# Patient Record
Sex: Male | Born: 1990
Health system: Southern US, Community
[De-identification: ages and names within clinical notes are randomized; demographics above are authoritative.]

## PROBLEM LIST (undated history)

## (undated) DIAGNOSIS — Z789 Other specified health status: Secondary | ICD-10-CM

## (undated) HISTORY — DX: Other specified health status: Z78.9

---
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
## 2019-06-07 ENCOUNTER — Ambulatory Visit
Admission: EM | Admit: 2019-06-07 | Discharge: 2019-06-07 | Disposition: A | Discharge status: Home / Self Care | Payer: 59 | Attending: Physician Assistant | Admitting: Physician Assistant

## 2019-06-07 ENCOUNTER — Other Ambulatory Visit: Payer: Self-pay

## 2019-06-07 ENCOUNTER — Encounter: Payer: Self-pay | Admitting: Emergency Medicine

## 2019-06-07 DIAGNOSIS — L2489 Irritant contact dermatitis due to other agents: Secondary | ICD-10-CM

## 2019-06-07 MED ORDER — PREDNISONE 50 MG PO TABS: 50.0000 mg | ORAL_TABLET | Freq: Every day | ORAL | 0 refills | Status: DC

## 2019-06-07 NOTE — Discharge Instructions (Signed)
Start prednisone as directed. Continue cream/benadryl. Ice compress to the eyes. Follow up for reevaluation with signs of infection including spreading redness, warmth, pain.

## 2019-06-07 NOTE — ED Triage Notes (Addendum)
Pt presents to [facility] for assessment of rash after doing yard work with his brother starting 3 days ago.  Rash to right forehead and below left eye.  Pt now c/o swelling to interior eye and was concerned for spreading.  Pt has been using benadryl topical cream to area for itching relief

## 2019-06-07 NOTE — ED Notes (Signed)
Patient able to ambulate independently  

## 2019-06-07 NOTE — ED Provider Notes (Signed)
[name]    CSN: [id number] Arrival date & time: 06/07/19  0801      History   Chief Complaint Chief Complaint  Patient presents with  . Rash    HPI Miguel Farmer is a 28 y.o. male.   28 year old male comes in for 3-day history of rash.  States was doing yard work, and touched Physicist, medical.  Rash is to the right forehead, left cheek.  Area was pruritic in nature, and has been applying Benadryl cream with some relief.  States noticing rash spreading towards the eye, with swelling to the nasal aspect of the left lower eyelid, and therefore came in for evaluation.  Denies spreading erythema, warmth, pain.  Denies fever, chills, body aches.  Denies vision changes, eye discharge, conjunctival injection, photophobia.     History reviewed. No pertinent past medical history.  There are no active problems to display for this patient.   History reviewed. No pertinent surgical history.     Home Medications    Prior to Admission medications   Medication Sig Start Date End Date Taking? Authorizing Provider  predniSONE (DELTASONE) 50 MG tablet Take 1 tablet (50 mg total) by mouth daily with breakfast. 06/07/19   Ok Edwards, PA-C    Family History Family History  Problem Relation Age of Onset  . Hypertension Mother   . Hypertension Father     Social History Social History   Tobacco Use  . Smoking status: Never Smoker  . Smokeless tobacco: Never Used  Substance Use Topics  . Alcohol use: Yes    Comment: socially  . Drug use: Never     Allergies   Patient has no known allergies.   Review of Systems Review of Systems  Reason unable to perform ROS: See HPI as above.     Physical Exam Triage Vital Signs ED Triage Vitals [06/07/19 0813]  Enc Vitals Group     BP (!) 148/84     Pulse Rate 76     Resp 18     Temp 98 F (36.7 C)     Temp Source Temporal     SpO2 97 %     Weight      Height      Head Circumference      Peak Flow      Pain  Score 0     Pain Loc      Pain Edu?      Excl. in [place]?    No data found.  Updated Vital Signs BP (!) 148/84 (BP Location: Left Arm)   Pulse 76   Temp 98 F (36.7 C) (Temporal)   Resp 18   SpO2 97%    Physical Exam Constitutional:      General: He is not in acute distress.    Appearance: He is well-developed. He is not diaphoretic.  HENT:     Head: Normocephalic and atraumatic.  Eyes:     Extraocular Movements: Extraocular movements intact.     Conjunctiva/sclera: Conjunctivae normal.     Pupils: Pupils are equal, round, and reactive to light.     Comments: Left lower eyelid swelling without erythema, warmth.  No tenderness to palpation.  No pain with EOM.  Pulmonary:     Effort: Pulmonary effort is normal. No respiratory distress.  Skin:    General: Skin is warm and dry.     Comments: Maculopapular rash with skin thickening and surrounding erythema to the right forehead and left cheek.  No tenderness to palpation.  No fluctuance, warmth.  Neurological:     Mental Status: He is alert and oriented to person, place, and time.      UC Treatments / Results  Labs (all labs ordered are listed, but only abnormal results are displayed) Labs Reviewed - No data to display  EKG   Radiology No results found.  Procedures Procedures (including critical care time)  Medications Ordered in UC Medications - No data to display  Initial Impression / Assessment and Plan / UC Course  I have reviewed the triage vital signs and the nursing notes.  Pertinent labs & imaging results that were available during my care of the patient were reviewed by me and considered in my medical decision making (see chart for details).    Start prednisone as directed.  Patient apply ice compress to the eyes.  Return precautions given.  Patient expresses understanding and agrees to plan.  Final Clinical Impressions(s) / UC Diagnoses   Final diagnoses:  Irritant contact dermatitis due to other  agents   ED Prescriptions    Medication Sig Dispense Auth. Provider   predniSONE (DELTASONE) 50 MG tablet Take 1 tablet (50 mg total) by mouth daily with breakfast. 5 tablet Ok Edwards, PA-C     PDMP not reviewed this encounter.   Ok Edwards, PA-C 06/07/19 [phone]

## 2019-09-02 ENCOUNTER — Other Ambulatory Visit: Payer: 59

## 2019-10-02 DIAGNOSIS — Z20828 Contact with and (suspected) exposure to other viral communicable diseases: Secondary | ICD-10-CM | POA: Diagnosis not present

## 2019-10-02 DIAGNOSIS — Z03818 Encounter for observation for suspected exposure to other biological agents ruled out: Secondary | ICD-10-CM | POA: Diagnosis not present

## 2019-12-15 ENCOUNTER — Other Ambulatory Visit: Payer: Self-pay

## 2019-12-15 ENCOUNTER — Other Ambulatory Visit: Payer: Self-pay | Admitting: Family Medicine

## 2019-12-15 ENCOUNTER — Ambulatory Visit: Payer: 59 | Admitting: Family Medicine

## 2019-12-15 ENCOUNTER — Encounter: Payer: Self-pay | Admitting: Family Medicine

## 2019-12-15 VITALS — BP 108/62 | HR 81 | Temp 96.1°F | Ht 71.0 in | Wt 185.1 lb

## 2019-12-15 DIAGNOSIS — E785 Hyperlipidemia, unspecified: Secondary | ICD-10-CM

## 2019-12-15 DIAGNOSIS — Z0000 Encounter for general adult medical examination without abnormal findings: Secondary | ICD-10-CM

## 2019-12-15 LAB — LIPID PANEL
Cholesterol: 195 mg/dL (ref 0–200)
HDL: 44.1 mg/dL (ref >39.00)
NonHDL: 151.05
Total CHOL/HDL Ratio: 4
Triglycerides: 209 mg/dL — ABNORMAL HIGH (ref 0.0–149.0)
VLDL: 41.8 mg/dL — ABNORMAL HIGH (ref 0.0–40.0)

## 2019-12-15 LAB — COMPREHENSIVE METABOLIC PANEL
ALT: 30 U/L (ref 0–53)
AST: 21 U/L (ref 0–37)
Albumin: 4.7 g/dL (ref 3.5–5.2)
Alkaline Phosphatase: 56 U/L (ref 39–117)
BUN: 13 mg/dL (ref 6–23)
CO2: 28 mEq/L (ref 19–32)
Calcium: 9.6 mg/dL (ref 8.4–10.5)
Chloride: 103 mEq/L (ref 96–112)
Creatinine, Ser: 0.96 mg/dL (ref 0.40–1.50)
GFR: 91.95 mL/min (ref >60.00)
Glucose, Bld: 91 mg/dL (ref 70–99)
Potassium: 4.3 mEq/L (ref 3.5–5.1)
Sodium: 139 mEq/L (ref 135–145)
Total Bilirubin: 0.8 mg/dL (ref 0.2–1.2)
Total Protein: 7.2 g/dL (ref 6.0–8.3)

## 2019-12-15 LAB — CBC
HCT: 43.3 % (ref 39.0–52.0)
Hemoglobin: 14.5 g/dL (ref 13.0–17.0)
MCHC: 33.5 g/dL (ref 30.0–36.0)
MCV: 85.3 fl (ref 78.0–100.0)
Platelets: 226 10*3/uL (ref 150.0–400.0)
RBC: 5.08 Mil/uL (ref 4.22–5.81)
RDW: 13.1 % (ref 11.5–15.5)
WBC: 5 10*3/uL (ref 4.0–10.5)

## 2019-12-15 LAB — LDL CHOLESTEROL, DIRECT: Direct LDL: 110 mg/dL

## 2019-12-15 NOTE — Patient Instructions (Addendum)
Give [place] 2-3 business days to get the results of your labs back.   Keep the diet clean and stay active.  Do monthly self testicular checks in the shower. You are feeling for lumps/bumps that don't belong. If you feel anything like this, let me know!   If anything changes with the skin lesions on your thigh or foot, let me know. I am reassured in the fact that nothing has changed over the past 6 years.   Let us know if you need anything.

## 2019-12-15 NOTE — Progress Notes (Signed)
Chief Complaint  Patient presents with  . New Patient (Initial Visit)    Well Male Miguel Farmer is here for a complete physical.   His last physical was >1 year ago.  Current diet: in general, a "healthy" diet.   Current exercise: Active with kids and at home Weight trend: had lost some weight Daytime fatigue? No. Seat belt? Yes.    Health maintenance Tetanus- Yes HIV- Yes  Past Medical History:  Diagnosis Date  . No known health problems      Past Surgical History:  Procedure Laterality Date  . WISDOM TOOTH EXTRACTION  2007    Medications  Takes no meds routinely.    Allergies No Known Allergies  Family History Family History  Problem Relation Age of Onset  . Liver disease Mother 54       NAFLD    Review of Systems: Constitutional: no fevers or chills Eye:  no recent significant change in vision Ear/Nose/Mouth/Throat:  Ears:  no hearing loss Nose/Mouth/Throat:  no complaints of nasal congestion, no sore throat Cardiovascular:  no chest pain Respiratory:  no shortness of breath Gastrointestinal:  no abdominal pain, no change in bowel habits GU:  Male: negative for dysuria, frequency, and incontinence Musculoskeletal/Extremities:  no pain of the joints Integumentary (Skin/Breast): +2 dark lesions on R foot and back of L thigh that have not changed in 6 years; otherwise no abnormal skin lesions reported Neurologic:  no headaches Endocrine: No unexpected weight changes Hematologic/Lymphatic:  no night sweats  Exam BP 108/62 (BP Location: Left Arm, Patient Position: Sitting, Cuff Size: Normal)   Pulse 81   Temp (!) 96.1 F (35.6 C) (Temporal)   Ht 5\' 11"  (1.803 m)   Wt 185 lb 2 oz (84 kg)   SpO2 98%   BMI 25.82 kg/m  General:  well developed, well nourished, in no apparent distress Skin: See below; otherwise no significant moles, warts, or growths Head:  no masses, lesions, or tenderness Eyes:  pupils equal and round, sclera anicteric without  injection Ears:  canals without lesions, TMs shiny without retraction, no obvious effusion, no erythema Nose:  nares patent, septum midline, mucosa normal Throat/Pharynx:  lips and gingiva without lesion; tongue and uvula midline; non-inflamed pharynx; no exudates or postnasal drainage Neck: neck supple without adenopathy, thyromegaly, or masses Lungs:  clear to auscultation, breath sounds equal bilaterally, no respiratory distress Cardio:  regular rate and rhythm, no bruits, no LE edema Abdomen:  abdomen soft, nontender; bowel sounds normal; no masses or organomegaly Rectal: Deferred Musculoskeletal:  symmetrical muscle groups noted without atrophy or deformity Extremities:  no clubbing, cyanosis, or edema, no deformities, no skin discoloration Neuro:  gait normal; deep tendon reflexes normal and symmetric Psych: well oriented with normal range of affect and appropriate judgment/insight   Posterior L thigh   Dorsum of R foot  Assessment and Plan  Well adult exam - Plan: Lipid panel, CBC, Comprehensive metabolic panel   Well 29 y.o. male. Counseled on diet and exercise. Self testicular exams recommended at least monthly.  Other orders as above. Keep an eye on skin lesions, he will let us know if they change.  Follow up in 1 year pending the above workup. The patient voiced understanding and agreement to the plan.  [place], DO 12/15/19 7:45 AM

## 2020-02-02 ENCOUNTER — Other Ambulatory Visit (INDEPENDENT_AMBULATORY_CARE_PROVIDER_SITE_OTHER): Payer: 59

## 2020-02-02 ENCOUNTER — Other Ambulatory Visit: Payer: Self-pay

## 2020-02-02 ENCOUNTER — Other Ambulatory Visit: Payer: Self-pay | Admitting: Family Medicine

## 2020-02-02 DIAGNOSIS — E785 Hyperlipidemia, unspecified: Secondary | ICD-10-CM

## 2020-02-02 LAB — LIPID PANEL
Cholesterol: 159 mg/dL (ref 0–200)
HDL: 41.3 mg/dL (ref >39.00)
LDL Cholesterol: 80 mg/dL (ref 0–99)
NonHDL: 117.35
Total CHOL/HDL Ratio: 4
Triglycerides: 189 mg/dL — ABNORMAL HIGH (ref 0.0–149.0)
VLDL: 37.8 mg/dL (ref 0.0–40.0)

## 2020-02-15 ENCOUNTER — Other Ambulatory Visit: Payer: Self-pay

## 2020-02-15 ENCOUNTER — Ambulatory Visit
Admission: RE | Admit: 2020-02-15 | Discharge: 2020-02-15 | Disposition: A | Discharge status: Home / Self Care | Payer: 59 | Source: Ambulatory Visit | Attending: Emergency Medicine | Admitting: Emergency Medicine

## 2020-02-15 VITALS — BP 131/94 | HR 83 | Temp 98.3°F | Resp 18

## 2020-02-15 DIAGNOSIS — L237 Allergic contact dermatitis due to plants, except food: Secondary | ICD-10-CM | POA: Diagnosis not present

## 2020-02-15 MED ORDER — PREDNISONE 10 MG (21) PO TBPK: ORAL_TABLET | Freq: Every day | ORAL | 0 refills | Status: DC

## 2020-02-15 MED ORDER — TRIAMCINOLONE ACETONIDE 0.5 % EX OINT: 1.0000 "application " | TOPICAL_OINTMENT | Freq: Two times a day (BID) | CUTANEOUS | 0 refills | Status: DC

## 2020-02-15 MED FILL — TRIAMCINOLONE 0.5% OINTMENT: 0.5 | 15 days supply | Qty: 30 | Fill #0

## 2020-02-15 MED FILL — predniSONE 10 MG (21) TBPK: 10 | 6 days supply | Qty: 21 | Fill #0

## 2020-02-15 NOTE — ED Provider Notes (Signed)
[name]    CSN: [id number] Arrival date & time: 02/15/20  2725      History   Chief Complaint Chief Complaint  Patient presents with  . Rash    HPI Miguel Farmer is a 29 y.o. male presenting for poison ivy dermatitis to bilateral lower extremities.  Patient states that this began a week ago when he was out in the brush on his property chasing dogs away from his chickens.  Denies dog bite, fall.  Has been keeping clean and dry, using calamine lotion with some relief.  Denying fever, chills, throbs, myalgias, warmth, discharge.  States he has tolerated steroids well in the past.   Past Medical History:  Diagnosis Date  . No known health problems     There are no problems to display for this patient.   Past Surgical History:  Procedure Laterality Date  . [place] EXTRACTION  2007       Home Medications    Prior to Admission medications   Medication Sig Start Date End Date Taking? Authorizing Provider  predniSONE (STERAPRED UNI-PAK 21 TAB) 10 MG (21) TBPK tablet Take by mouth daily. Take steroid taper as written 02/15/20   Hall-Potvin, [place], PA-C  triamcinolone ointment (KENALOG) 0.5 % Apply 1 application topically 2 (two) times daily. 02/15/20   Hall-Potvin, [place], PA-C    Family History Family History  Problem Relation Age of Onset  . Liver disease Mother 23       NAFLD    Social History Social History   Tobacco Use  . Smoking status: Never Smoker  . Smokeless tobacco: Never Used  Substance Use Topics  . Alcohol use: Yes    Comment: socially  . Drug use: Never     Allergies   Patient has no known allergies.   Review of Systems As per HPI   Physical Exam Triage Vital Signs ED Triage Vitals  Enc Vitals Group     BP      Pulse      Resp      Temp      Temp src      SpO2      Weight      Height      Head Circumference      Peak Flow      Pain Score      Pain Loc      Pain Edu?      Excl. in [place]?    No data  found.  Updated Vital Signs BP (!) 131/94 (BP Location: Left Arm)   Pulse 83   Temp 98.3 F (36.8 C)   Resp 18   SpO2 97%   Visual Acuity Right Eye Distance:   Left Eye Distance:   Bilateral Distance:    Right Eye Near:   Left Eye Near:    Bilateral Near:     Physical Exam Constitutional:      General: He is not in acute distress. HENT:     Head: Normocephalic and atraumatic.  Eyes:     General: No scleral icterus.    Pupils: Pupils are equal, round, and reactive to light.  Cardiovascular:     Rate and Rhythm: Normal rate.  Pulmonary:     Effort: Pulmonary effort is normal. No respiratory distress.     Breath sounds: No wheezing.  Skin:    Coloration: Skin is not jaundiced or pale.     Findings: Rash present.     Comments:  Moderate to severe poison ivy dermatitis noted to bilateral lower extremities without active discharge, streaking, tenderness.  Neurological:     Mental Status: He is alert and oriented to person, place, and time.      UC Treatments / Results  Labs (all labs ordered are listed, but only abnormal results are displayed) Labs Reviewed - No data to display  EKG   Radiology No results found.  Procedures Procedures (including critical care time)  Medications Ordered in UC Medications - No data to display  Initial Impression / Assessment and Plan / UC Course  I have reviewed the triage vital signs and the nursing notes.  Pertinent labs & imaging results that were available during my care of the patient were reviewed by me and considered in my medical decision making (see chart for details).     Patient afebrile, nontoxic in office today.  No signs/symptoms at this time of secondary cellulitis.  Will start prednisone taper as outlined below.  Return precautions discussed, patient verbalized understanding and is agreeable to plan. Final Clinical Impressions(s) / UC Diagnoses   Final diagnoses:  Poison ivy dermatitis     Discharge  Instructions     Keep skin clean - may use gentle soaps without perfumes/dyes. Avoid hot water (showers, baths) as this can further dry out and irritate skin. Pat skin dry as rubbing can irritate and tear skin. Apply a gentle moisturizer 1-2 times daily. Take steroid as discussed with breakfast: 6-5-4-3-2-1 Return for worsening rash, fever, body aches.    ED Prescriptions    Medication Sig Dispense Auth. Provider   predniSONE (STERAPRED UNI-PAK 21 TAB) 10 MG (21) TBPK tablet Take by mouth daily. Take steroid taper as written 21 tablet Hall-Potvin, [place], PA-C   triamcinolone ointment (KENALOG) 0.5 % Apply 1 application topically 2 (two) times daily. 30 g Hall-Potvin, [place], PA-C     PDMP not reviewed this encounter.   Hall-Potvin, [place], [place] 02/15/20 [phone]

## 2020-02-15 NOTE — ED Triage Notes (Signed)
APP assessment occurred prior to RN assessment, please see APP note.

## 2020-02-15 NOTE — Discharge Instructions (Signed)
Keep skin clean - may use gentle soaps without perfumes/dyes. Avoid hot water (showers, baths) as this can further dry out and irritate skin. Pat skin dry as rubbing can irritate and tear skin. Apply a gentle moisturizer 1-2 times daily. Take steroid as discussed with breakfast: 6-5-4-3-2-1 Return for worsening rash, fever, body aches.

## 2020-02-15 NOTE — ED Notes (Signed)
Patient able to ambulate independently  

## 2020-04-29 ENCOUNTER — Ambulatory Visit: Payer: 59 | Admitting: Family Medicine

## 2020-04-29 ENCOUNTER — Other Ambulatory Visit: Payer: Self-pay

## 2020-04-29 ENCOUNTER — Encounter: Payer: Self-pay | Admitting: Family Medicine

## 2020-04-29 VITALS — BP 122/88 | HR 84 | Temp 98.3°F | Resp 18 | Ht 71.0 in | Wt 199.6 lb

## 2020-04-29 DIAGNOSIS — L247 Irritant contact dermatitis due to plants, except food: Secondary | ICD-10-CM

## 2020-04-29 MED ORDER — METHYLPREDNISOLONE ACETATE 80 MG/ML IJ SUSP
80.0000 mg | Freq: Once | INTRAMUSCULAR | Status: AC
  Administered 2020-04-29: 80 mg via INTRAMUSCULAR

## 2020-04-29 MED ORDER — PREDNISONE 10 MG PO TABS: ORAL_TABLET | ORAL | 0 refills | Status: DC

## 2020-04-29 MED FILL — predniSONE 10 MG TABS: 10 | 12 days supply | Qty: 20 | Fill #0

## 2020-04-29 NOTE — Patient Instructions (Signed)
Contact Dermatitis Dermatitis is redness, soreness, and swelling (inflammation) of the skin. Contact dermatitis is a reaction to certain substances that touch the skin. Many different substances can cause contact dermatitis. There are two types of contact dermatitis:  Irritant contact dermatitis. This type is caused by something that irritates your skin, such as having dry hands from washing them too often with soap. This type does not require previous exposure to the substance for a reaction to occur. This is the most common type.  Allergic contact dermatitis. This type is caused by a substance that you are allergic to, such as poison ivy. This type occurs when you have been exposed to the substance (allergen) and develop a sensitivity to it. Dermatitis may develop soon after your first exposure to the allergen, or it may not develop until the next time you are exposed and every time thereafter. What are the causes? Irritant contact dermatitis is most commonly caused by exposure to:  Makeup.  Soaps.  Detergents.  Bleaches.  Acids.  Metal salts, such as nickel. Allergic contact dermatitis is most commonly caused by exposure to:  Poisonous plants.  Chemicals.  Jewelry.  Latex.  Medicines.  Preservatives in products, such as clothing. What increases the risk? You are more likely to develop this condition if you have:  A job that exposes you to irritants or allergens.  Certain medical conditions, such as asthma or eczema. What are the signs or symptoms? Symptoms of this condition may occur on your body anywhere the irritant has touched you or is touched by you.  Symptoms include: ? Dryness or flaking. ? Redness. ? Cracks. ? Itching. ? Pain or a burning feeling. ? Blisters. ? Drainage of small amounts of blood or clear fluid from skin cracks. With allergic contact dermatitis, there may also be swelling in areas such as the eyelids, mouth, or genitals. How is this  diagnosed? This condition is diagnosed with a medical history and physical exam.  A patch skin test may be performed to help determine the cause.  If the condition is related to your job, you may need to see an occupational medicine specialist. How is this treated? This condition is treated by checking for the cause of the reaction and protecting your skin from further contact. Treatment may also include:  Steroid creams or ointments. Oral steroid medicines may be needed in more severe cases.  Antibiotic medicines or antibacterial ointments, if a skin infection is present.  Antihistamine lotion or an antihistamine taken by mouth to ease itching.  A bandage (dressing). Follow these instructions at home: Skin care  Moisturize your skin as needed.  Apply cool compresses to the affected areas.  Try applying baking soda paste to your skin. Stir water into baking soda until it reaches a paste-like consistency.  Do not scratch your skin, and avoid friction to the affected area.  Avoid the use of soaps, perfumes, and dyes. Medicines  Take or apply over-the-counter and prescription medicines only as told by your health care provider.  If you were prescribed an antibiotic medicine, take or apply the antibiotic as told by your health care provider. Do not stop using the antibiotic even if your condition improves. Bathing  Try taking a bath with: ? Epsom salts. Follow the instructions on the packaging. You can get these at your local pharmacy or grocery store. ? Baking soda. Pour a small amount into the bath as directed by your health care provider. ? Colloidal oatmeal. Follow the instructions on the   packaging. You can get this at your local pharmacy or grocery store.  Bathe less frequently, such as every other day.  Bathe in lukewarm water. Avoid using hot water. Bandage care  If you were given a bandage (dressing), change it as told by your health care provider.  Wash your hands  with soap and water before and after you change your dressing. If soap and water are not available, use hand sanitizer. General instructions  Avoid the substance that caused your reaction. If you do not know what caused it, keep a journal to try to track what caused it. Write down: ? What you eat. ? What cosmetic products you use. ? What you drink. ? What you wear in the affected area. This includes jewelry.  Check the affected areas every day for signs of infection. Check for: ? More redness, swelling, or pain. ? More fluid or blood. ? Warmth. ? Pus or a bad smell.  Keep all follow-up visits as told by your health care provider. This is important. Contact a health care provider if:  Your condition does not improve with treatment.  Your condition gets worse.  You have signs of infection such as swelling, tenderness, redness, soreness, or warmth in the affected area.  You have a fever.  You have new symptoms. Get help right away if:  You have a severe headache, neck pain, or neck stiffness.  You vomit.  You feel very sleepy.  You notice red streaks coming from the affected area.  Your bone or joint underneath the affected area becomes painful after the skin has healed.  The affected area turns darker.  You have difficulty breathing. Summary  Dermatitis is redness, soreness, and swelling (inflammation) of the skin. Contact dermatitis is a reaction to certain substances that touch the skin.  Symptoms of this condition may occur on your body anywhere the irritant has touched you or is touched by you.  This condition is treated by figuring out what caused the reaction and protecting your skin from further contact. Treatment may also include medicines and skin care.  Avoid the substance that caused your reaction. If you do not know what caused it, keep a journal to try to track what caused it.  Contact a health care provider if your condition gets worse or you have signs  of infection such as swelling, tenderness, redness, soreness, or warmth in the affected area. This information is not intended to replace advice given to you by your health care provider. Make sure you discuss any questions you have with your health care provider. Document Revised: 11/26/2018 Document Reviewed: 02/19/2018 Elsevier Patient Education  2020 [employer]vier Inc.  

## 2020-04-29 NOTE — Progress Notes (Signed)
Patient ID: Miguel Farmer, male    DOB: 24-Apr-1991  Age: 29 y.o. MRN: [id number]    Subjective:  Subjective  HPI CALIEB LICHTMAN presents for rash after being in contact with poison sumac, ivy ---- he was chasing after a fox that was going after his free range chickens in his yard.    He is in the process of clearing the poison ivy but has not yet and is highly allergic to it   Review of Systems  Constitutional: Negative for appetite change, diaphoresis, fatigue and unexpected weight change.  Eyes: Negative for pain, redness and visual disturbance.  Respiratory: Negative for cough, chest tightness, shortness of breath and wheezing.   Cardiovascular: Negative for chest pain, palpitations and leg swelling.  Endocrine: Negative for cold intolerance, heat intolerance, polydipsia, polyphagia and polyuria.  Genitourinary: Negative for difficulty urinating, dysuria and frequency.  Skin: Positive for rash.  Neurological: Negative for dizziness, light-headedness, numbness and headaches.    History Past Medical History:  Diagnosis Date  . No known health problems     He has a past surgical history that includes Wisdom tooth extraction (2007).   His family history includes Liver disease (age of onset: 58) in his mother.He reports that he has never smoked. He has never used smokeless tobacco. He reports current alcohol use. He reports that he does not use drugs.  No current outpatient medications on file prior to visit.   No current facility-administered medications on file prior to visit.     Objective:  Objective  Physical Exam Vitals and nursing note reviewed.  Constitutional:      General: He is sleeping.     Appearance: He is well-developed.  HENT:     Head: Normocephalic and atraumatic.  Eyes:     Pupils: Pupils are equal, round, and reactive to light.  Neck:     Thyroid: No thyromegaly.  Cardiovascular:     Rate and Rhythm: Normal rate and regular rhythm.     Heart sounds: No  murmur heard.   Pulmonary:     Effort: Pulmonary effort is normal. No respiratory distress.     Breath sounds: Normal breath sounds. No wheezing or rales.  Chest:     Chest wall: No tenderness.  Musculoskeletal:        General: No tenderness.     Cervical back: Normal range of motion and neck supple.  Skin:    General: Skin is warm and dry.     Findings: Rash present. Rash is vesicular.       Neurological:     Mental Status: He is oriented to person, place, and time.  Psychiatric:        Behavior: Behavior normal.        Thought Content: Thought content normal.        Judgment: Judgment normal.    BP 122/88   Pulse 84   Temp 98.3 F (36.8 C) (Oral)   Resp 18   Ht 5\' 11"  (1.803 m)   Wt 199 lb 9.6 oz (90.5 kg)   SpO2 97%   BMI 27.84 kg/m  Wt Readings from Last 3 Encounters:  04/29/20 199 lb 9.6 oz (90.5 kg)  12/15/19 185 lb 2 oz (84 kg)     Lab Results  Component Value Date   WBC 5.0 12/15/2019   HGB 14.5 12/15/2019   HCT 43.3 12/15/2019   PLT 226.0 12/15/2019   GLUCOSE 91 12/15/2019   CHOL 159 02/02/2020   TRIG  189.0 (H) 02/02/2020   HDL 41.30 02/02/2020   LDLDIRECT 110.0 12/15/2019   LDLCALC 80 02/02/2020   ALT 30 12/15/2019   AST 21 12/15/2019   NA 139 12/15/2019   K 4.3 12/15/2019   CL 103 12/15/2019   CREATININE 0.96 12/15/2019   BUN 13 12/15/2019   CO2 28 12/15/2019    No results found.   Assessment & Plan:  Plan  I have discontinued Vishnu Moeller. [place]'s predniSONE and triamcinolone ointment. I am also having him start on predniSONE. We administered methylPREDNISolone acetate.  Meds ordered this encounter  Medications  . predniSONE (DELTASONE) 10 MG tablet    Sig: TAKE 3 TABLETS PO QD FOR 3 DAYS THEN TAKE 2 TABLETS PO QD FOR 3 DAYS THEN TAKE 1 TABLET PO QD FOR 3 DAYS THEN TAKE 1/2 TAB PO QD FOR 3 DAYS    Dispense:  20 tablet    Refill:  0  . methylPREDNISolone acetate (DEPO-MEDROL) injection 80 mg    Problem List Items Addressed This Visit      None    Visit Diagnoses    Contact dermatitis and eczema due to plant    -  Primary   Relevant Medications   predniSONE (DELTASONE) 10 MG tablet   methylPREDNISolone acetate (DEPO-MEDROL) injection 80 mg (Completed)     Can use benadryl for itching rto prn  Follow-up: Return if symptoms worsen or fail to improve.  Ann Held, DO

## 2020-06-03 NOTE — Addendum Note (Signed)
Addended by: Kelle Darting A on: 06/03/2020 03:17 PM   Modules accepted: Orders

## 2020-06-07 ENCOUNTER — Other Ambulatory Visit: Payer: 59

## 2020-11-16 ENCOUNTER — Other Ambulatory Visit: Payer: Self-pay

## 2020-11-16 ENCOUNTER — Ambulatory Visit: Payer: 59 | Admitting: Family Medicine

## 2020-11-16 ENCOUNTER — Other Ambulatory Visit: Payer: Self-pay | Admitting: Family Medicine

## 2020-11-16 ENCOUNTER — Encounter: Payer: Self-pay | Admitting: Family Medicine

## 2020-11-16 VITALS — BP 124/86 | HR 85 | Temp 98.3°F | Ht 71.0 in | Wt 211.1 lb

## 2020-11-16 DIAGNOSIS — L255 Unspecified contact dermatitis due to plants, except food: Secondary | ICD-10-CM

## 2020-11-16 MED ORDER — PREDNISONE 20 MG PO TABS: ORAL_TABLET | ORAL | 0 refills | Status: DC

## 2020-11-16 MED ORDER — LEVOCETIRIZINE DIHYDROCHLORIDE 5 MG PO TABS: 5.0000 mg | ORAL_TABLET | Freq: Every evening | ORAL | 2 refills | Status: DC

## 2020-11-16 MED FILL — LEVOCETIRIZINE 5 MG TABLET: 5 | 30 days supply | Qty: 30 | Fill #0

## 2020-11-16 MED FILL — predniSONE 20 MG TABS: 20 | 21 days supply | Qty: 42 | Fill #0

## 2020-11-16 NOTE — Patient Instructions (Signed)
Try not to itch.  Ice/cold pack over area for 10-15 min twice daily.  Things to look out for: increasing pain not relieved by ibuprofen/acetaminophen, fevers, spreading redness, drainage of pus, or foul odor.  Let us know if you need anything.

## 2020-11-16 NOTE — Progress Notes (Signed)
Chief Complaint  Patient presents with  . Poison Ivy    Miguel Farmer is a 30 y.o. male here for a skin complaint.  Duration: 6 days Location: forearms and legs Pruritic? Yes Painful? No Drainage? Light oozing New soaps/lotions/topicals/detergents? No Sick contacts? No Other associated symptoms: spreading; he was exposed to poison sumac Therapies tried thus far: topical steroid cream  Past Medical History:  Diagnosis Date  . No known health problems     BP 124/86 (BP Location: Left Arm, Patient Position: Sitting, Cuff Size: Normal)   Pulse 85   Temp 98.3 F (36.8 C) (Oral)   Ht 5\' 11"  (1.803 m)   Wt 211 lb 2 oz (95.8 kg)   SpO2 98%   BMI 29.45 kg/m  Gen: awake, alert, appearing stated age Lungs: No accessory muscle use Skin: Bilateral forearms and bilateral lower extremities, linear streaks of pink and slightly raised skin, some excoriation, some vesicle formation noted. No drainage, erythema, TTP, fluctuance Psych: Age appropriate judgment and insight  Rhus dermatitis - Plan: predniSONE (DELTASONE) 20 MG tablet, levocetirizine (XYZAL) 5 MG tablet  Daily antihistamine, 3-week prednisone taper, 60 mg for a week, 40 mg for the next week, and 20 mg with a final week.  Ice/cold pack for symptoms.  Try not to itch. F/u prn. The patient voiced understanding and agreement to the plan.  [place], DO 11/16/20 7:58 AM

## 2021-01-09 ENCOUNTER — Other Ambulatory Visit: Payer: Self-pay

## 2021-01-09 ENCOUNTER — Ambulatory Visit (INDEPENDENT_AMBULATORY_CARE_PROVIDER_SITE_OTHER): Payer: 59 | Admitting: Family Medicine

## 2021-01-09 ENCOUNTER — Encounter: Payer: Self-pay | Admitting: Family Medicine

## 2021-01-09 VITALS — BP 120/80 | HR 50 | Temp 98.4°F | Ht 71.0 in | Wt 214.0 lb

## 2021-01-09 DIAGNOSIS — Z1159 Encounter for screening for other viral diseases: Secondary | ICD-10-CM | POA: Diagnosis not present

## 2021-01-09 DIAGNOSIS — Z0000 Encounter for general adult medical examination without abnormal findings: Secondary | ICD-10-CM

## 2021-01-09 LAB — LIPID PANEL
Cholesterol: 186 mg/dL (ref 0–200)
HDL: 41.4 mg/dL (ref >39.00)
NonHDL: 144.12
Total CHOL/HDL Ratio: 4
Triglycerides: 204 mg/dL — ABNORMAL HIGH (ref 0.0–149.0)
VLDL: 40.8 mg/dL — ABNORMAL HIGH (ref 0.0–40.0)

## 2021-01-09 LAB — LDL CHOLESTEROL, DIRECT: Direct LDL: 112 mg/dL

## 2021-01-09 LAB — CBC
HCT: 43.7 % (ref 39.0–52.0)
Hemoglobin: 14.7 g/dL (ref 13.0–17.0)
MCHC: 33.6 g/dL (ref 30.0–36.0)
MCV: 84.9 fl (ref 78.0–100.0)
Platelets: 236 10*3/uL (ref 150.0–400.0)
RBC: 5.14 Mil/uL (ref 4.22–5.81)
RDW: 13.7 % (ref 11.5–15.5)
WBC: 5.2 10*3/uL (ref 4.0–10.5)

## 2021-01-09 LAB — COMPREHENSIVE METABOLIC PANEL
ALT: 47 U/L (ref 0–53)
AST: 24 U/L (ref 0–37)
Albumin: 4.8 g/dL (ref 3.5–5.2)
Alkaline Phosphatase: 56 U/L (ref 39–117)
BUN: 21 mg/dL (ref 6–23)
CO2: 27 mEq/L (ref 19–32)
Calcium: 9.7 mg/dL (ref 8.4–10.5)
Chloride: 101 mEq/L (ref 96–112)
Creatinine, Ser: 0.96 mg/dL (ref 0.40–1.50)
GFR: 105.63 mL/min (ref >60.00)
Glucose, Bld: 94 mg/dL (ref 70–99)
Potassium: 4.8 mEq/L (ref 3.5–5.1)
Sodium: 137 mEq/L (ref 135–145)
Total Bilirubin: 0.6 mg/dL (ref 0.2–1.2)
Total Protein: 7.4 g/dL (ref 6.0–8.3)

## 2021-01-09 NOTE — Progress Notes (Signed)
Chief Complaint  Patient presents with  . Annual Exam    Well Male Miguel Farmer is here for a complete physical.   His last physical was >1 year ago.  Current diet: in general, a "fair" diet.   Current exercise: active in yard and with kids Weight trend: stable Fatigue out of ordinary? No. Seat belt? Yes.   Loss of interested in doing things or depression in past 2 weeks? No  Health maintenance Tetanus- Yes HIV- Yes Hep C- No  Past Medical History:  Diagnosis Date  . No known health problems      Past Surgical History:  Procedure Laterality Date  . [place] EXTRACTION  2007    Medications  Current Outpatient Medications on File Prior to Visit  Medication Sig Dispense Refill  . levocetirizine (XYZAL) 5 MG tablet TAKE 1 TABLET (5 MG TOTAL) BY MOUTH EVERY EVENING. 30 tablet 2    Allergies No Known Allergies  Family History Family History  Problem Relation Age of Onset  . Liver disease Mother 44       NAFLD    Review of Systems: Constitutional: no fevers or chills Eye:  no recent significant change in vision Ear/Nose/Mouth/Throat:  Ears:  no hearing loss Nose/Mouth/Throat:  no complaints of nasal congestion, no sore throat Cardiovascular:  no chest pain Respiratory:  no shortness of breath Gastrointestinal:  no abdominal pain, no change in bowel habits GU:  Male: negative for dysuria Musculoskeletal/Extremities:  no pain of the joints Integumentary (Skin/Breast):  no abnormal skin lesions reported Neurologic:  no headaches Endocrine: No unexpected weight changes Hematologic/Lymphatic:  no night sweats  Exam BP 120/80 (BP Location: Right Arm, Patient Position: Sitting, Cuff Size: Normal)   Pulse (!) 50   Temp 98.4 F (36.9 C) (Oral)   Ht 5\' 11"  (1.803 m)   Wt 214 lb (97.1 kg)   SpO2 98%   BMI 29.85 kg/m  General:  well developed, well nourished, in no apparent distress Skin:  no significant moles, warts, or growths Head:  no masses, lesions, or  tenderness Eyes:  pupils equal and round, sclera anicteric without injection Ears:  canals without lesions, TMs shiny without retraction, no obvious effusion, no erythema Nose:  nares patent, septum midline, mucosa normal Throat/Pharynx:  lips and gingiva without lesion; tongue and uvula midline; non-inflamed pharynx; no exudates or postnasal drainage Neck: neck supple without adenopathy, thyromegaly, or masses Lungs:  clear to auscultation, breath sounds equal bilaterally, no respiratory distress Cardio:  regular rate and rhythm, no bruits, no LE edema Abdomen:  abdomen soft, nontender; bowel sounds normal; no masses or organomegaly Genital (male): Deferred Rectal: Deferred Musculoskeletal:  symmetrical muscle groups noted without atrophy or deformity Extremities:  no clubbing, cyanosis, or edema, no deformities, no skin discoloration Neuro:  gait normal; deep tendon reflexes normal and symmetric Psych: well oriented with normal range of affect and appropriate judgment/insight  Assessment and Plan  Well adult exam - Plan: CBC, Lipid panel, Comprehensive metabolic panel  Encounter for hepatitis C screening test for low risk patient - Plan: Hepatitis C antibody   Well 30 y.o. male. Counseled on diet and exercise. Self testicular exams recommended at least monthly.  Other orders as above. Follow up in 1 year pending the above workup. The patient voiced understanding and agreement to the plan.  [place], DO 01/09/21 11:29 AM

## 2021-01-09 NOTE — Patient Instructions (Signed)
Give us 2-3 business days to get the results of your labs back.   Keep the diet clean and stay active.  Do monthly self testicular checks in the shower. You are feeling for lumps/bumps that don't belong. If you feel anything like this, let me know!  Let us know if you need anything. 

## 2021-01-10 ENCOUNTER — Other Ambulatory Visit: Payer: Self-pay | Admitting: Family Medicine

## 2021-01-10 DIAGNOSIS — E785 Hyperlipidemia, unspecified: Secondary | ICD-10-CM

## 2021-01-10 LAB — HEPATITIS C ANTIBODY
Hepatitis C Ab: NONREACTIVE
SIGNAL TO CUT-OFF: 0.05 (ref <1.00)

## 2021-01-24 ENCOUNTER — Ambulatory Visit (INDEPENDENT_AMBULATORY_CARE_PROVIDER_SITE_OTHER): Payer: 59

## 2021-01-24 ENCOUNTER — Other Ambulatory Visit (HOSPITAL_BASED_OUTPATIENT_CLINIC_OR_DEPARTMENT_OTHER): Payer: Self-pay

## 2021-01-24 ENCOUNTER — Ambulatory Visit
Admission: EM | Admit: 2021-01-24 | Discharge: 2021-01-24 | Disposition: A | Discharge status: Home / Self Care | Payer: 59 | Attending: Emergency Medicine | Admitting: Emergency Medicine

## 2021-01-24 DIAGNOSIS — S97112A Crushing injury of left great toe, initial encounter: Secondary | ICD-10-CM

## 2021-01-24 DIAGNOSIS — S99922A Unspecified injury of left foot, initial encounter: Secondary | ICD-10-CM | POA: Diagnosis not present

## 2021-01-24 DIAGNOSIS — L255 Unspecified contact dermatitis due to plants, except food: Secondary | ICD-10-CM

## 2021-01-24 DIAGNOSIS — S90212A Contusion of left great toe with damage to nail, initial encounter: Secondary | ICD-10-CM

## 2021-01-24 MED ORDER — MUPIROCIN 2 % EX OINT: 1.0000 "application " | TOPICAL_OINTMENT | Freq: Two times a day (BID) | CUTANEOUS | 0 refills | Status: DC

## 2021-01-24 MED ORDER — MUPIROCIN 2 % EX OINT
1.0000 "application " | TOPICAL_OINTMENT | Freq: Two times a day (BID) | CUTANEOUS | 0 refills | Status: DC
  Filled 2021-01-24: qty 22, 11d supply, fill #0

## 2021-01-24 NOTE — Discharge Instructions (Addendum)
Use anti-inflammatories for pain/swelling. You may take up to 800 mg Ibuprofen every 8 hours with food. You may supplement Ibuprofen with Tylenol [phone] mg every 8 hours.  Ice and elevate toe May soak in warm water to further help break down blood, apply Bactroban twice daily around nail bed to help prevent infection  Follow-up if not improving or worsening

## 2021-01-24 NOTE — ED Triage Notes (Signed)
Patient presents to Urgent Care with complaints of left toe injury yesterday. He states he was moving wood and fell on top of big toe. Bruising noted with mild swelling. Treating pain with ibuprofen.   Denies numbness or tingling sensation.

## 2021-01-24 NOTE — ED Provider Notes (Signed)
[name]    CSN: [id number] Arrival date & time: 01/24/21  7741      History   Chief Complaint Chief Complaint  Patient presents with  . Toe Injury    Left toe     HPI Miguel Farmer is a 30 y.o. male presenting today for evaluation of left great toe injury.  Reports dropped wood onto his left great toe.  Since has developed increased bruising underneath nail bed as well as pain with weightbearing.  Using ibuprofen.  Denies numbness or tingling.  Denies prior fracture.  HPI  Past Medical History:  Diagnosis Date  . No known health problems     There are no problems to display for this patient.   Past Surgical History:  Procedure Laterality Date  . [place] EXTRACTION  2007       Home Medications    Prior to Admission medications   Medication Sig Start Date End Date Taking? Authorizing Provider  levocetirizine (XYZAL) 5 MG tablet TAKE 1 TABLET (5 MG TOTAL) BY MOUTH EVERY EVENING. 11/16/20 11/16/21  Shelda Pal, DO  mupirocin ointment (BACTROBAN) 2 % Apply 1 application topically 2 (two) times daily. 01/24/21   [place]en [place]out, Elesa Hacker, PA-C    Family History Family History  Problem Relation Age of Onset  . Liver disease Mother 90       NAFLD    Social History Social History   Tobacco Use  . Smoking status: Never Smoker  . Smokeless tobacco: Never Used  Substance Use Topics  . Alcohol use: Yes    Comment: socially  . Drug use: Never     Allergies   Patient has no known allergies.   Review of Systems Review of Systems  Constitutional: Negative for fatigue and fever.  Eyes: Negative for redness, itching and visual disturbance.  Respiratory: Negative for shortness of breath.   Cardiovascular: Negative for chest pain and leg swelling.  Gastrointestinal: Negative for nausea and vomiting.  Musculoskeletal: Positive for arthralgias. Negative for myalgias.  Skin: Positive for color change. Negative for rash and wound.   Neurological: Negative for dizziness, syncope, weakness, light-headedness and headaches.     Physical Exam Triage Vital Signs ED Triage Vitals [01/24/21 1050]  Enc Vitals Group     BP 133/85     Pulse Rate 69     Resp 16     Temp      Temp Source Oral     SpO2 96 %     Weight      Height      Head Circumference      Peak Flow      Pain Score 6     Pain Loc      Pain Edu?      Excl. in [place]?    No data found.  Updated Vital Signs BP 133/85 (BP Location: Right Arm)   Pulse 69   Resp 16   SpO2 96%   Visual Acuity Right Eye Distance:   Left Eye Distance:   Bilateral Distance:    Right Eye Near:   Left Eye Near:    Bilateral Near:     Physical Exam Vitals and nursing note reviewed.  Constitutional:      Appearance: He is well-developed.     Comments: No acute distress  HENT:     Head: Normocephalic and atraumatic.     Nose: Nose normal.  Eyes:     Conjunctiva/sclera: Conjunctivae normal.  Cardiovascular:  Rate and Rhythm: Normal rate.  Pulmonary:     Effort: Pulmonary effort is normal. No respiratory distress.  Abdominal:     General: There is no distension.  Musculoskeletal:        General: Normal range of motion.     Cervical back: Neck supple.     Comments: Left great toe with subungual hematoma, surrounding nailbed erythema and swelling, tender to palpation to proximal phalanx Nontender throughout dorsum of foot, dorsalis pedis 2+  Skin:    General: Skin is warm and dry.  Neurological:     Mental Status: He is alert and oriented to person, place, and time.      UC Treatments / Results  Labs (all labs ordered are listed, but only abnormal results are displayed) Labs Reviewed - No data to display  EKG   Radiology DG Toe Great Left  Result Date: 01/24/2021 CLINICAL DATA:  Left great toe pain after crush injury yesterday. EXAM: LEFT GREAT TOE COMPARISON:  None. FINDINGS: There is no evidence of fracture or dislocation. There is no evidence  of arthropathy or other focal bone abnormality. Soft tissues are unremarkable. IMPRESSION: Negative. Electronically Signed   By: Marijo Conception M.D.   On: 01/24/2021 11:47    Procedures Incision and Drainage  Date/Time: 01/24/2021 12:21 PM Performed by: Dana Dorner, [place] C, PA-C Authorized by: Norene Oliveri, Elesa Hacker, PA-C   Consent:    Consent obtained:  Verbal   Consent given by:  Patient   Risks discussed:  Bleeding, pain, incomplete drainage and infection   Alternatives discussed:  No treatment Universal protocol:    Patient identity confirmed:  Verbally with patient Location:    Type:  Subungual hematoma   Location:  Lower extremity   Lower extremity location:  Toe   Toe location:  L big toe Anesthesia:    Anesthesia method:  None Procedure type:    Complexity:  Simple Procedure details:    Incision depth:  Subungual   Drainage:  Bloody   Drainage amount:  Moderate   Wound treatment:  Wound left open Post-procedure details:    Procedure completion:  Tolerated well, no immediate complications   (including critical care time)  Medications Ordered in UC Medications - No data to display  Initial Impression / Assessment and Plan / UC Course  I have reviewed the triage vital signs and the nursing notes.  Pertinent labs & imaging results that were available during my care of the patient were reviewed by me and considered in my medical decision making (see chart for details).     Subungual hematoma left great toe, no sign of fracture on x-ray, drainage performed, recommended anti-inflammatories ice elevate monitor for gradual resolution, may soak in warm water to continue to help with breaking down hematoma, dry well and apply Bactroban afterward.  Discussed strict return precautions. Patient verbalized understanding and is agreeable with plan.  Final Clinical Impressions(s) / UC Diagnoses   Final diagnoses:  Subungual hematoma of great toe of left foot, initial encounter   Injury of left great toe, initial encounter     Discharge Instructions     Use anti-inflammatories for pain/swelling. You may take up to 800 mg Ibuprofen every 8 hours with food. You may supplement Ibuprofen with Tylenol [phone] mg every 8 hours.  Ice and elevate toe May soak in warm water to further help break down blood, apply Bactroban twice daily around nail bed to help prevent infection  Follow-up if not improving or worsening  ED Prescriptions    Medication Sig Dispense Auth. Provider   mupirocin ointment (BACTROBAN) 2 %  (Status: Discontinued) Apply 1 application topically 2 (two) times daily. 30 g Anneta Rounds C, PA-C   mupirocin ointment (BACTROBAN) 2 % Apply 1 application topically 2 (two) times daily. 22 g Willi [place], [place] C, PA-C     PDMP not reviewed this encounter.   Kresha Abelson, [place] C, PA-C 01/24/21 1223

## 2021-02-02 DIAGNOSIS — M25572 Pain in left ankle and joints of left foot: Secondary | ICD-10-CM | POA: Diagnosis not present

## 2021-02-03 ENCOUNTER — Other Ambulatory Visit (HOSPITAL_BASED_OUTPATIENT_CLINIC_OR_DEPARTMENT_OTHER): Payer: Self-pay

## 2021-02-03 MED ORDER — DICLOFENAC SODIUM 50 MG PO TBEC
DELAYED_RELEASE_TABLET | ORAL | 1 refills | Status: DC
  Filled 2021-02-03: qty 60, 30d supply, fill #0

## 2021-03-02 ENCOUNTER — Other Ambulatory Visit (INDEPENDENT_AMBULATORY_CARE_PROVIDER_SITE_OTHER): Payer: 59

## 2021-03-02 ENCOUNTER — Other Ambulatory Visit: Payer: Self-pay | Admitting: Family Medicine

## 2021-03-02 ENCOUNTER — Telehealth: Payer: Self-pay | Admitting: Family Medicine

## 2021-03-02 ENCOUNTER — Other Ambulatory Visit: Payer: Self-pay

## 2021-03-02 DIAGNOSIS — E785 Hyperlipidemia, unspecified: Secondary | ICD-10-CM

## 2021-03-02 LAB — LIPID PANEL
Cholesterol: 205 mg/dL — ABNORMAL HIGH (ref 0–200)
HDL: 38.8 mg/dL — ABNORMAL LOW (ref >39.00)
LDL Cholesterol: 134 mg/dL — ABNORMAL HIGH (ref 0–99)
NonHDL: 166.03
Total CHOL/HDL Ratio: 5
Triglycerides: 161 mg/dL — ABNORMAL HIGH (ref 0.0–149.0)
VLDL: 32.2 mg/dL (ref 0.0–40.0)

## 2021-03-02 MED ORDER — ROSUVASTATIN CALCIUM 5 MG PO TABS: 5.0000 mg | ORAL_TABLET | Freq: Every day | ORAL | 3 refills | Status: DC

## 2021-03-02 NOTE — Telephone Encounter (Signed)
Patient would like prescription to be send to  [facility] Phone:  [phone]  Fax:  [phone]     rosuvastatin (CRESTOR) 5 MG tablet [[id number]]

## 2021-03-03 ENCOUNTER — Other Ambulatory Visit (HOSPITAL_BASED_OUTPATIENT_CLINIC_OR_DEPARTMENT_OTHER): Payer: Self-pay

## 2021-03-03 MED ORDER — ROSUVASTATIN CALCIUM 5 MG PO TABS
5.0000 mg | ORAL_TABLET | Freq: Every day | ORAL | 3 refills | Status: DC
  Filled 2021-03-03: qty 90, 90d supply, fill #0

## 2021-03-03 MED ORDER — CARESTART COVID-19 HOME TEST VI KIT
PACK | 0 refills | Status: DC
  Filled 2021-03-03: qty 2, 4d supply, fill #0

## 2021-03-03 NOTE — Telephone Encounter (Signed)
Sent in

## 2021-03-12 DIAGNOSIS — Z20822 Contact with and (suspected) exposure to covid-19: Secondary | ICD-10-CM | POA: Diagnosis not present

## 2021-03-22 ENCOUNTER — Other Ambulatory Visit (HOSPITAL_COMMUNITY): Payer: Self-pay

## 2021-04-14 ENCOUNTER — Other Ambulatory Visit (INDEPENDENT_AMBULATORY_CARE_PROVIDER_SITE_OTHER): Payer: 59

## 2021-04-14 ENCOUNTER — Other Ambulatory Visit: Payer: Self-pay

## 2021-04-14 ENCOUNTER — Other Ambulatory Visit: Payer: Self-pay | Admitting: Family Medicine

## 2021-04-14 DIAGNOSIS — E785 Hyperlipidemia, unspecified: Secondary | ICD-10-CM

## 2021-04-14 LAB — LIPID PANEL
Cholesterol: 141 mg/dL (ref 0–200)
HDL: 37.5 mg/dL — ABNORMAL LOW (ref >39.00)
NonHDL: 103.55
Total CHOL/HDL Ratio: 4
Triglycerides: 329 mg/dL — ABNORMAL HIGH (ref 0.0–149.0)
VLDL: 65.8 mg/dL — ABNORMAL HIGH (ref 0.0–40.0)

## 2021-04-14 LAB — LDL CHOLESTEROL, DIRECT: Direct LDL: 61 mg/dL

## 2021-04-14 MED ORDER — ATORVASTATIN CALCIUM 20 MG PO TABS: 20.0000 mg | ORAL_TABLET | Freq: Every day | ORAL | 3 refills | Status: DC

## 2021-07-03 ENCOUNTER — Other Ambulatory Visit: Payer: Self-pay

## 2021-09-25 ENCOUNTER — Encounter: Payer: Self-pay | Admitting: Family Medicine

## 2021-09-26 ENCOUNTER — Other Ambulatory Visit: Payer: Self-pay | Admitting: Family Medicine

## 2021-09-26 DIAGNOSIS — Z3009 Encounter for other general counseling and advice on contraception: Secondary | ICD-10-CM

## 2021-10-25 ENCOUNTER — Other Ambulatory Visit (HOSPITAL_BASED_OUTPATIENT_CLINIC_OR_DEPARTMENT_OTHER): Payer: Self-pay

## 2021-10-25 DIAGNOSIS — Z3009 Encounter for other general counseling and advice on contraception: Secondary | ICD-10-CM | POA: Diagnosis not present

## 2021-10-25 MED ORDER — DIAZEPAM 5 MG PO TABS
10.0000 mg | ORAL_TABLET | Freq: Every day | ORAL | 0 refills | Status: DC
  Filled 2021-10-25: qty 2, 1d supply, fill #0

## 2021-10-25 MED ORDER — CEPHALEXIN 500 MG PO CAPS
500.0000 mg | ORAL_CAPSULE | Freq: Two times a day (BID) | ORAL | 0 refills | Status: DC
  Filled 2021-10-25: qty 2, 1d supply, fill #0

## 2021-10-25 MED ORDER — OXYCODONE-ACETAMINOPHEN 5-325 MG PO TABS
1.0000 | ORAL_TABLET | ORAL | 0 refills | Status: DC | PRN
  Filled 2021-10-25: qty 6, 1d supply, fill #0

## 2021-10-31 DIAGNOSIS — Z1283 Encounter for screening for malignant neoplasm of skin: Secondary | ICD-10-CM | POA: Diagnosis not present

## 2021-10-31 DIAGNOSIS — D2271 Melanocytic nevi of right lower limb, including hip: Secondary | ICD-10-CM | POA: Diagnosis not present

## 2021-10-31 DIAGNOSIS — D2272 Melanocytic nevi of left lower limb, including hip: Secondary | ICD-10-CM | POA: Diagnosis not present

## 2021-10-31 DIAGNOSIS — D485 Neoplasm of uncertain behavior of skin: Secondary | ICD-10-CM | POA: Diagnosis not present

## 2021-10-31 DIAGNOSIS — D225 Melanocytic nevi of trunk: Secondary | ICD-10-CM | POA: Diagnosis not present

## 2021-11-15 DIAGNOSIS — D485 Neoplasm of uncertain behavior of skin: Secondary | ICD-10-CM | POA: Diagnosis not present

## 2021-11-15 DIAGNOSIS — D225 Melanocytic nevi of trunk: Secondary | ICD-10-CM | POA: Diagnosis not present

## 2022-03-19 IMAGING — DX DG TOE GREAT 2+V*L*
3 series · 3 of 3 positions shown · non-contrast
Comparison: None.

CLINICAL DATA: Left great toe pain after crush injury yesterday.

EXAM:
LEFT GREAT TOE

[hallux dp (1 of 2)]
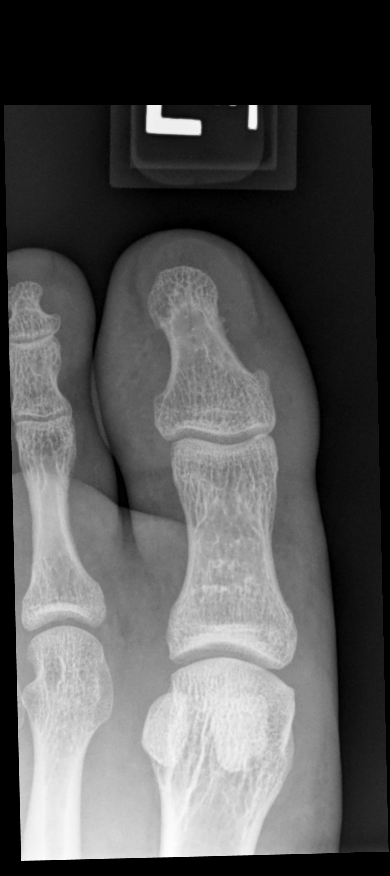

[hallux dp (2 of 2)]
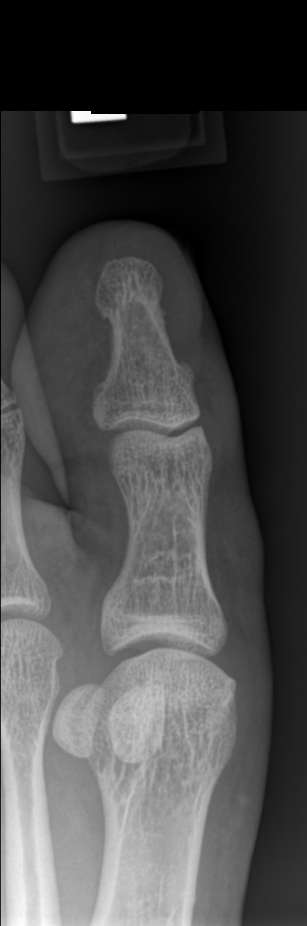

[hallux lat]
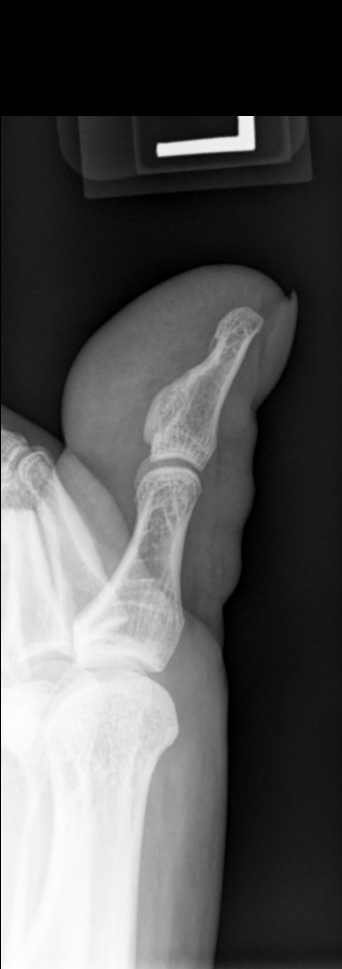

[3 of 3 positions shown; findings below may reference images not displayed]

FINDINGS: There is no evidence of fracture or dislocation. There is no
evidence of arthropathy or other focal bone abnormality. Soft
tissues are unremarkable.
IMPRESSION: Negative.

## 2022-10-24 ENCOUNTER — Other Ambulatory Visit: Payer: Self-pay | Admitting: Family Medicine

## 2022-10-24 ENCOUNTER — Encounter: Payer: Self-pay | Admitting: Family Medicine

## 2022-10-24 ENCOUNTER — Ambulatory Visit (INDEPENDENT_AMBULATORY_CARE_PROVIDER_SITE_OTHER): Payer: Commercial Managed Care - PPO | Admitting: Family Medicine

## 2022-10-24 ENCOUNTER — Other Ambulatory Visit (HOSPITAL_BASED_OUTPATIENT_CLINIC_OR_DEPARTMENT_OTHER): Payer: Self-pay

## 2022-10-24 VITALS — BP 121/80 | HR 82 | Temp 98.3°F | Ht 71.0 in | Wt 209.0 lb

## 2022-10-24 DIAGNOSIS — E785 Hyperlipidemia, unspecified: Secondary | ICD-10-CM

## 2022-10-24 DIAGNOSIS — Z0000 Encounter for general adult medical examination without abnormal findings: Secondary | ICD-10-CM

## 2022-10-24 LAB — COMPREHENSIVE METABOLIC PANEL
ALT: 42 U/L (ref 0–53)
AST: 29 U/L (ref 0–37)
Albumin: 4.8 g/dL (ref 3.5–5.2)
Alkaline Phosphatase: 58 U/L (ref 39–117)
BUN: 16 mg/dL (ref 6–23)
CO2: 27 mEq/L (ref 19–32)
Calcium: 10.2 mg/dL (ref 8.4–10.5)
Chloride: 102 mEq/L (ref 96–112)
Creatinine, Ser: 0.96 mg/dL (ref 0.40–1.50)
GFR: 104.31 mL/min (ref >60.00)
Glucose, Bld: 87 mg/dL (ref 70–99)
Potassium: 5.1 mEq/L (ref 3.5–5.1)
Sodium: 137 mEq/L (ref 135–145)
Total Bilirubin: 0.6 mg/dL (ref 0.2–1.2)
Total Protein: 7.7 g/dL (ref 6.0–8.3)

## 2022-10-24 LAB — CBC
HCT: 46.6 % (ref 39.0–52.0)
Hemoglobin: 15.6 g/dL (ref 13.0–17.0)
MCHC: 33.5 g/dL (ref 30.0–36.0)
MCV: 84.8 fl (ref 78.0–100.0)
Platelets: 284 10*3/uL (ref 150.0–400.0)
RBC: 5.5 Mil/uL (ref 4.22–5.81)
RDW: 13.1 % (ref 11.5–15.5)
WBC: 6.6 10*3/uL (ref 4.0–10.5)

## 2022-10-24 LAB — LIPID PANEL
Cholesterol: 200 mg/dL (ref 0–200)
HDL: 42.6 mg/dL (ref >39.00)
NonHDL: 157.87
Total CHOL/HDL Ratio: 5
Triglycerides: 236 mg/dL — ABNORMAL HIGH (ref 0.0–149.0)
VLDL: 47.2 mg/dL — ABNORMAL HIGH (ref 0.0–40.0)

## 2022-10-24 LAB — LDL CHOLESTEROL, DIRECT: Direct LDL: 117 mg/dL

## 2022-10-24 MED ORDER — VALACYCLOVIR HCL 1 G PO TABS
2000.0000 mg | ORAL_TABLET | Freq: Every day | ORAL | 1 refills | Status: AC
  Filled 2022-10-24: qty 30, 15d supply, fill #0

## 2022-10-24 NOTE — Progress Notes (Signed)
Chief Complaint  Patient presents with   Annual Exam    Well Male Miguel Farmer is here for a complete physical.   His last physical was >1 year ago.  Current diet: in general, diet is starting to improve.    Current exercise: none Weight trend: increased a little as of late, trying to lose again Fatigue out of ordinary? No. Seat belt? Yes.   Advanced directive? No  Health maintenance Tetanus- Yes HIV- Yes Hep C- Yes  Past Medical History:  Diagnosis Date   No known health problems     Past Surgical History:  Procedure Laterality Date   WISDOM TOOTH EXTRACTION  2007   Medications  Takes no meds routinely.    Allergies No Known Allergies  Family History Family History  Problem Relation Age of Onset   Liver disease Mother 66       NAFLD    Review of Systems: Constitutional: no fevers or chills Eye:  no recent significant change in vision Ear/Nose/Mouth/Throat:  Ears:  no hearing loss Nose/Mouth/Throat:  no complaints of nasal congestion, no sore throat Cardiovascular:  no chest pain Respiratory:  no shortness of breath Gastrointestinal:  no abdominal pain, no change in bowel habits GU:  Male: negative for dysuria Musculoskeletal/Extremities:  no pain of the joints Integumentary (Skin/Breast):  no abnormal skin lesions reported Neurologic:  no headaches Endocrine: No unexpected weight changes Hematologic/Lymphatic:  no night sweats  Exam BP 121/80 (BP Location: Left Arm, Patient Position: Sitting, Cuff Size: Normal)   Pulse 82   Temp 98.3 F (36.8 C) (Oral)   Ht '5\' 11"'$  (1.803 m)   Wt 209 lb (94.8 kg)   SpO2 98%   BMI 29.15 kg/m  General:  well developed, well nourished, in no apparent distress Skin:  no significant moles, warts, or growths Head:  no masses, lesions, or tenderness Eyes:  pupils equal and round, sclera anicteric without injection Ears:  canals without lesions, TMs shiny without retraction, no obvious effusion, no erythema Nose:  nares  patent, mucosa normal Throat/Pharynx:  lips and gingiva without lesion; tongue and uvula midline; non-inflamed pharynx; no exudates or postnasal drainage Neck: neck supple without adenopathy, thyromegaly, or masses Lungs:  clear to auscultation, breath sounds equal bilaterally, no respiratory distress Cardio:  regular rate and rhythm, no bruits, no LE edema Abdomen:  abdomen soft, nontender; bowel sounds normal; no masses or organomegaly Genital (male): Deferred Rectal: Deferred Musculoskeletal:  symmetrical muscle groups noted without atrophy or deformity Extremities:  no clubbing, cyanosis, or edema, no deformities, no skin discoloration Neuro:  gait normal; deep tendon reflexes normal and symmetric Psych: well oriented with normal range of affect and appropriate judgment/insight  Assessment and Plan  Well adult exam - Plan: CBC, Comprehensive metabolic panel, Lipid panel   Well 32 y.o. male. Counseled on diet and exercise. Self testicular exams recommended at least monthly.  Advanced directive form provided today.  Other orders as above. Follow up in 1 year pending the above workup. The patient voiced understanding and agreement to the plan.  [place], DO 10/24/22 10:48 AM

## 2022-10-24 NOTE — Patient Instructions (Signed)
Give [place] 2-3 business days to get the results of your labs back.   Keep the diet clean and stay active.  Please get me a copy of your advanced directive form at your convenience.   Do monthly self testicular checks in the shower. You are feeling for lumps/bumps that don't belong. If you feel anything like this, let me know!  Let us know if you need anything.

## 2022-11-28 ENCOUNTER — Other Ambulatory Visit (INDEPENDENT_AMBULATORY_CARE_PROVIDER_SITE_OTHER): Payer: Commercial Managed Care - PPO

## 2022-11-28 DIAGNOSIS — E785 Hyperlipidemia, unspecified: Secondary | ICD-10-CM | POA: Diagnosis not present

## 2022-11-28 LAB — LIPID PANEL
Cholesterol: 180 mg/dL (ref 0–200)
HDL: 37.4 mg/dL — ABNORMAL LOW (ref >39.00)
LDL Cholesterol: 121 mg/dL — ABNORMAL HIGH (ref 0–99)
NonHDL: 142.94
Total CHOL/HDL Ratio: 5
Triglycerides: 108 mg/dL (ref 0.0–149.0)
VLDL: 21.6 mg/dL (ref 0.0–40.0)

## 2023-03-18 ENCOUNTER — Encounter: Payer: Self-pay | Admitting: Family Medicine

## 2023-03-18 DIAGNOSIS — Z3009 Encounter for other general counseling and advice on contraception: Secondary | ICD-10-CM

## 2023-04-04 ENCOUNTER — Other Ambulatory Visit (HOSPITAL_BASED_OUTPATIENT_CLINIC_OR_DEPARTMENT_OTHER): Payer: Self-pay

## 2023-04-04 ENCOUNTER — Ambulatory Visit: Payer: Commercial Managed Care - PPO | Admitting: Urology

## 2023-04-04 ENCOUNTER — Encounter: Payer: Self-pay | Admitting: Urology

## 2023-04-04 VITALS — BP 139/83 | HR 87 | Ht 71.0 in | Wt 206.0 lb

## 2023-04-04 DIAGNOSIS — Z3009 Encounter for other general counseling and advice on contraception: Secondary | ICD-10-CM

## 2023-04-04 MED ORDER — ALPRAZOLAM 1 MG PO TABS
ORAL_TABLET | ORAL | 0 refills | Status: DC
Start: 2023-04-04 — End: 2023-12-17
  Filled 2023-04-04 – 2023-04-26 (×2): qty 1, 1d supply, fill #0

## 2023-04-04 NOTE — Progress Notes (Signed)
Assessment: 1. Encounter for vasectomy assessment     Plan: Schedule for vasectomy per patient request Rx for alprazolam 1 mg pre-procedure provided.   Chief Complaint:  Chief Complaint  Patient presents with   VAS Consult    History of Present Illness:  Miguel Farmer is a 32 y.o. male who is seen for vasectomy evaluation. He is married with 3 children.  No history of scrotal trauma or infection.  Past Medical History:  Past Medical History:  Diagnosis Date   No known health problems     Past Surgical History:  Past Surgical History:  Procedure Laterality Date   WISDOM TOOTH EXTRACTION  2007    Allergies:  No Known Allergies  Family History:  Family History  Problem Relation Age of Onset   Liver disease Mother 30       NAFLD    Social History:  Social History   Tobacco Use   Smoking status: Never   Smokeless tobacco: Never  Substance Use Topics   Alcohol use: Yes    Comment: socially   Drug use: Never    Review of symptoms:  Constitutional:  Negative for unexplained weight loss, night sweats, fever, chills ENT:  Negative for nose bleeds, sinus pain, painful swallowing CV:  Negative for chest pain, shortness of breath, exercise intolerance, palpitations, loss of consciousness Resp:  Negative for cough, wheezing, shortness of breath GI:  Negative for nausea, vomiting, diarrhea, bloody stools GU:  Positives noted in HPI; otherwise negative for gross hematuria, dysuria, urinary incontinence Neuro:  Negative for seizures, poor balance, limb weakness, slurred speech Psych:  Negative for lack of energy, depression, anxiety Endocrine:  Negative for polydipsia, polyuria, symptoms of hypoglycemia (dizziness, hunger, sweating) Hematologic:  Negative for anemia, purpura, petechia, prolonged or excessive bleeding, use of anticoagulants  Allergic:  Negative for difficulty breathing or choking as a result of exposure to anything; no shellfish allergy; no allergic  response (rash/itch) to materials, foods  Physical exam: BP 139/83   Pulse 87   Ht 5\' 11"  (1.803 m)   Wt 206 lb (93.4 kg)   BMI 28.73 kg/m  GENERAL APPEARANCE:  Well appearing, well developed, well nourished, NAD HEENT:  Atraumatic, normocephalic, oropharynx clear NECK:  Supple without lymphadenopathy or thyromegaly ABDOMEN:  Soft, non-tender, no masses EXTREMITIES:  Moves all extremities well, without clubbing, cyanosis, or edema NEUROLOGIC:  Alert and oriented x 3, normal gait, CN II-XII grossly intact MENTAL STATUS:  appropriate BACK:  Non-tender to palpation, No CVAT SKIN:  Warm, dry, and intact GU: Penis:  circumcised Meatus: Normal Scrotum: vas palpated bilaterally Testis: normal without masses bilateral Epididymis: normal  Results: None  VASECTOMY CONSULTATION  [name] presents for vasectomy consultation today.  He is a 32 y.o. male, Married with 3  children .  He and his wife have discussed the issues regarding long-term fertility and are comfortable with this decision.  He presents for consideration for vasectomy.  I discussed the issues in detail with him today and he expressed no reservations.  As to the procedure, no scalpel technique vasectomy is explained and reviewed in detail.  Generalized risks including but not limited to bleeding, infection, orchalgia, testicular atrophy, epididymitis, scrotal hematoma, and chronic pain are discussed.   Additionally, he understands that the possibility of vas recanalization following vasectomy is possible although rare.  Most importantly, the patient understands that he is not sterile initially and will need a semen analysis check to confirm sterility such that no sperm  are seen.  He is advised to avoid ejaculation for 10 days following the procedure.  The initial semen analysis will be checked in approximately 12 weeks and in some patients, several months may be required for clearance of all sperm.  He reports a clear  understanding of the need for continued birth control until sterility is confirmed.  Otherwise, general issues regarding local anesthesia, prep, alprazolam are discussed and he reports a clear understanding.

## 2023-04-04 NOTE — Patient Instructions (Signed)
Taking care of yourself after a VASECTOMY                                              Patient Information Sheet        The following information will reinforce some of the instructions that your doctor has given you.  Day of Procedure: 1) Wear the scrotal supporter and gauze pad 2) Use an ice pack on the scrotum for 15 minutes every hour for 48 hours to help reduce discomfort, swelling and bruising (do NOT place ice directly on your skin, but place on top of the supporter) 3) Expect some clear to pinkish drainage at the surgical site for the first 24-48 hours 4) If needed, use pain medications provided or ibuprofen 800 mg every 8 hours for discomfort 5) Avoid strenuous activities like mowing, lifting, jogging and exercising for 1 week.  Take it easy! 6) If you develop a fever over 101 F or sudden onset of significant swelling within the first 12 hours, please call to report this to your doctor as soon as possible.     Day Two and Three: 1) You may take a shower, but avoid tubs, pools or hot tubs. 2) Continue to wear the scrotal supporter as needed for comfort and change or remove the gauze pad if desired 3) Keep taking it easy!  Avoid strenuous activities like mowing, lifting, jogging and exercising.   4) Continue to watch for signs or symptoms of fever or significant swelling 5) Apply a small amount of antibiotic ointment to incision 1-2 times/day  The rest of the week: 1) Gradually return to normal physical activities after one week.  A return of soreness might mean you are        "doing too much too soon". 2) Avoid sexual activity for 10 days after the procedure 3) Continue to take a shower, but avoid tubs, pools or hot tubs 4) Wearing the scrotal supporter is optional based on your comfort.     Remember to use an alternate form of contraception for 3 months until you have been checked and CLEARED by your urologist!  3-Month lab appointment:  1) The lab technician will need to  look at a semen sample under a microscope  2) Use the specimen cup provided to collect the sample AT HOME 1 hour before the appointment  3) DO NOT refrigerate the specimen, but keep at room or body temperature  4) Avoid ejaculation for 2-5 days before collecting the specimen  5) Collect the entire specimen by masturbation using NO lubricant  6) Make sure your name, MR number, date and time of collection are on the cup  

## 2023-04-15 ENCOUNTER — Other Ambulatory Visit (HOSPITAL_BASED_OUTPATIENT_CLINIC_OR_DEPARTMENT_OTHER): Payer: Self-pay

## 2023-04-26 ENCOUNTER — Other Ambulatory Visit (HOSPITAL_BASED_OUTPATIENT_CLINIC_OR_DEPARTMENT_OTHER): Payer: Self-pay

## 2023-05-02 ENCOUNTER — Ambulatory Visit: Payer: Commercial Managed Care - PPO | Admitting: Urology

## 2023-05-02 ENCOUNTER — Other Ambulatory Visit (HOSPITAL_BASED_OUTPATIENT_CLINIC_OR_DEPARTMENT_OTHER): Payer: Self-pay

## 2023-05-02 ENCOUNTER — Encounter: Payer: Self-pay | Admitting: Urology

## 2023-05-02 VITALS — BP 138/99 | HR 79 | Ht 71.0 in | Wt 208.0 lb

## 2023-05-02 DIAGNOSIS — Z302 Encounter for sterilization: Secondary | ICD-10-CM | POA: Diagnosis not present

## 2023-05-02 MED ORDER — CEPHALEXIN 500 MG PO CAPS
500.0000 mg | ORAL_CAPSULE | Freq: Three times a day (TID) | ORAL | 0 refills | Status: AC
  Filled 2023-05-02: qty 9, 3d supply, fill #0

## 2023-05-02 NOTE — Patient Instructions (Signed)
Taking care of yourself after a VASECTOMY                                              Patient Information Sheet        The following information will reinforce some of the instructions that your doctor has given you.  Day of Procedure: 1) Wear the scrotal supporter and gauze pad 2) Use an ice pack on the scrotum for 15 minutes every hour for 48 hours to help reduce discomfort, swelling and bruising (do NOT place ice directly on your skin, but place on top of the supporter) 3) Expect some clear to pinkish drainage at the surgical site for the first 24-48 hours 4) If needed, use pain medications provided or ibuprofen 800 mg every 8 hours for discomfort 5) Avoid strenuous activities like mowing, lifting, jogging and exercising for 1 week.  Take it easy! 6) If you develop a fever over 101 F or sudden onset of significant swelling within the first 12 hours, please call to report this to your doctor as soon as possible.     Day Two and Three: 1) You may take a shower, but avoid tubs, pools or hot tubs. 2) Continue to wear the scrotal supporter as needed for comfort and change or remove the gauze pad if desired 3) Keep taking it easy!  Avoid strenuous activities like mowing, lifting, jogging and exercising.   4) Continue to watch for signs or symptoms of fever or significant swelling 5) Apply a small amount of antibiotic ointment to incision 1-2 times/day  The rest of the week: 1) Gradually return to normal physical activities after one week.  A return of soreness might mean you are        "doing too much too soon". 2) Avoid sexual activity for 10 days after the procedure 3) Continue to take a shower, but avoid tubs, pools or hot tubs 4) Wearing the scrotal supporter is optional based on your comfort.     Remember to use an alternate form of contraception for 3 months until you have been checked and CLEARED by your urologist!  3-Month lab appointment:  1) The lab technician will need to  look at a semen sample under a microscope  2) Use the specimen cup provided to collect the sample AT HOME 1 hour before the appointment  3) DO NOT refrigerate the specimen, but keep at room or body temperature  4) Avoid ejaculation for 2-5 days before collecting the specimen  5) Collect the entire specimen by masturbation using NO lubricant  6) Make sure your name, MR number, date and time of collection are on the cup  

## 2023-05-02 NOTE — Progress Notes (Signed)
   Assessment: 1. Encounter for vasectomy     Plan: Post vasectomy instructions given Rx sent Post vasectomy semen analysis in 12 weeks  Chief Complaint:  Chief Complaint  Patient presents with   VAS    History of Present Illness:  Miguel Farmer is a 32 y.o. male presents for vasectomy today. He is married with 3 children.  No history of scrotal trauma or infection.  Past Medical History:  Past Medical History:  Diagnosis Date   No known health problems     Past Surgical History:  Past Surgical History:  Procedure Laterality Date   WISDOM TOOTH EXTRACTION  2007    Allergies:  No Known Allergies  Family History:  Family History  Problem Relation Age of Onset   Liver disease Mother 57       NAFLD    Social History:  Social History   Tobacco Use   Smoking status: Never   Smokeless tobacco: Never  Substance Use Topics   Alcohol use: Yes    Comment: socially   Drug use: Never    ROS: Constitutional:  Negative for fever, chills, weight loss CV: Negative for chest pain, previous MI, hypertension Respiratory:  Negative for shortness of breath, wheezing, sleep apnea, frequent cough GI:  Negative for nausea, vomiting, bloody stool, GERD  Physical exam: BP (!) 138/99   Pulse 79   Ht 5\' 11"  (1.803 m)   Wt 208 lb (94.3 kg)   BMI 29.01 kg/m  GENERAL APPEARANCE:  Well appearing, well developed, well nourished, NAD HEENT:  Atraumatic, normocephalic, oropharynx clear NECK:  Supple without lymphadenopathy or thyromegaly ABDOMEN:  Soft, non-tender, no masses EXTREMITIES:  Moves all extremities well, without clubbing, cyanosis, or edema NEUROLOGIC:  Alert and oriented x 3, normal gait, CN II-XII grossly intact MENTAL STATUS:  appropriate BACK:  Non-tender to palpation, No CVAT SKIN:  Warm, dry, and intact  Results: None  VASECTOMY PROCEDURE:  Miguel Farmer presents for vasectomy following previous vasectomy consultation and permit is signed.  The patient's  anterior scrotal wall is shaved and prepped with Betadine in standard sterile fashion.  1% lidocaine is used as local anesthetic in the scrotal and peri vasal tissue.  A standard median raphe punch incision is made and a no scalpel technique vasectomy is performed.  Bilateral vas are isolated from the peri vasal tissue and an approximately 1 cm segment of vas is excised.  Proximal and distal segments are internally cauterized with electric heat cautery. Interposition of perivasal tissue was performed.  Bilateral palpation confirms bilateral vasectomy defect and no significant bleeding or hematoma is identified.  Neosporin gauze dressing and a scrotal support are applied.    Disposition: Patient is discharged home with Rx for pain medication and antibiotics.  Patient is given routine vasectomy instructions.   Most importantly, he is instructed and cautioned again regarding the need for protected intercourse until such time that a single  negative semen analysis has been obtained.  The initial semen analysis will be checked in approximately 12  weeks.  The patient reports a clear understanding.  He will call with any interval questions or concerns.

## 2023-08-01 ENCOUNTER — Other Ambulatory Visit: Payer: Commercial Managed Care - PPO

## 2023-08-01 DIAGNOSIS — Z302 Encounter for sterilization: Secondary | ICD-10-CM | POA: Diagnosis not present

## 2023-08-02 ENCOUNTER — Encounter: Payer: Self-pay | Admitting: Urology

## 2023-08-02 LAB — POST-VAS SPERM EVALUATION,QUAL: Volume: 2.5 mL

## 2023-12-17 ENCOUNTER — Ambulatory Visit (INDEPENDENT_AMBULATORY_CARE_PROVIDER_SITE_OTHER): Admitting: Family Medicine

## 2023-12-17 VITALS — BP 130/80 | HR 97 | Temp 98.0°F | Resp 16 | Ht 71.0 in | Wt 214.0 lb

## 2023-12-17 DIAGNOSIS — Z23 Encounter for immunization: Secondary | ICD-10-CM | POA: Diagnosis not present

## 2023-12-17 DIAGNOSIS — Z0000 Encounter for general adult medical examination without abnormal findings: Secondary | ICD-10-CM | POA: Diagnosis not present

## 2023-12-17 NOTE — Addendum Note (Signed)
 Addended by: Lorenza Shakir M on: 12/17/2023 04:32 PM   Modules accepted: Orders

## 2023-12-17 NOTE — Patient Instructions (Signed)
 [name] 2-3 business days to get the results of your labs back.   Keep the diet clean and stay active.  Do monthly self testicular checks in the shower. You are feeling for lumps/bumps that don't belong. If you feel anything like this, let me know!  Please get me a copy of your advanced directive form at your convenience.   Let Miguel Farmer know if you need anything.

## 2023-12-17 NOTE — Progress Notes (Signed)
 Chief Complaint  Patient presents with   Annual Exam    CPE    Well Male Miguel Farmer is here for a complete physical.   His last physical was >1 year ago.  Current diet: in general, diet could be better  Current exercise: active in house and in yard Weight trend: increased a bit Fatigue out of ordinary? No. Seat belt? Yes.   Advanced directive? No  Health maintenance Tetanus- Due HIV- No Hep C- No  Past Medical History:  Diagnosis Date   No known health problems      Past Surgical History:  Procedure Laterality Date   WISDOM TOOTH EXTRACTION  2007    Medications  Current Outpatient Medications on File Prior to Visit  Medication Sig Dispense Refill   valACYclovir  (VALTREX ) 1000 MG tablet Take 2 tablets (2,000 mg total) by mouth and repeat in 12 hours. In event of outbreak. 30 tablet 1    Allergies No Known Allergies  Family History Family History  Problem Relation Age of Onset   Liver disease Mother 82       NAFLD    Review of Systems: Constitutional: no fevers or chills Eye:  no recent significant change in vision Ear/Nose/Mouth/Throat:  Ears:  no hearing loss Nose/Mouth/Throat:  no complaints of nasal congestion, no sore throat Cardiovascular:  no chest pain Respiratory:  no shortness of breath Gastrointestinal:  no abdominal pain, no change in bowel habits GU:  Male: negative for dysuria Musculoskeletal/Extremities:  no pain of the joints Integumentary (Skin/Breast):  no abnormal skin lesions reported Neurologic:  no headaches Endocrine: No unexpected weight changes Hematologic/Lymphatic:  no night sweats  Exam BP 130/80 (BP Location: Left Arm, Patient Position: Sitting)   Pulse 97   Temp 98 F (36.7 C) (Oral)   Resp 16   Ht 5\' 11"  (1.803 m)   Wt 214 lb (97.1 kg)   SpO2 98%   BMI 29.85 kg/m  General:  well developed, well nourished, in no apparent distress Skin:  no significant moles, warts, or growths Head:  no masses, lesions, or  tenderness Eyes:  pupils equal and round, sclera anicteric without injection Ears:  canals without lesions, TMs shiny without retraction, no obvious effusion, no erythema Nose:  nares patent, mucosa normal Throat/Pharynx:  lips and gingiva without lesion; tongue and uvula midline; non-inflamed pharynx; no exudates or postnasal drainage Neck: neck supple without adenopathy, thyromegaly, or masses Lungs:  +diffuse wheezing (getting over cold), breath sounds equal bilaterally, no respiratory distress Cardio:  regular rate and rhythm, no bruits, no LE edema Abdomen:  abdomen soft, nontender; bowel sounds normal; no masses or organomegaly Genital (male): Deferred Rectal: Deferred Musculoskeletal:  symmetrical muscle groups noted without atrophy or deformity Extremities:  no clubbing, cyanosis, or edema, no deformities, no skin discoloration Neuro:  gait normal; deep tendon reflexes normal and symmetric Psych: well oriented with normal range of affect and appropriate judgment/insight  Assessment and Plan  Well adult exam - Plan: CBC, Comprehensive metabolic panel with GFR, Lipid panel   Well 33 y.o. male. Counseled on diet and exercise. Advanced directive form provided today.  Tdap today.  URI: getting better, opted for forego any further rx management and will cont supportive care. Send message in a few d if no better. Self testicular exams recommended at least monthly.  Other orders as above. Follow up in 1 year pending the above workup. The patient voiced understanding and agreement to the plan.  Shellie Dials Avimor, DO 12/17/23 4:11  PM

## 2023-12-20 ENCOUNTER — Other Ambulatory Visit (INDEPENDENT_AMBULATORY_CARE_PROVIDER_SITE_OTHER)

## 2023-12-20 DIAGNOSIS — Z0000 Encounter for general adult medical examination without abnormal findings: Secondary | ICD-10-CM | POA: Diagnosis not present

## 2023-12-20 LAB — COMPREHENSIVE METABOLIC PANEL WITH GFR
ALT: 33 U/L (ref 0–53)
AST: 17 U/L (ref 0–37)
Albumin: 4.6 g/dL (ref 3.5–5.2)
Alkaline Phosphatase: 43 U/L (ref 39–117)
BUN: 16 mg/dL (ref 6–23)
CO2: 26 meq/L (ref 19–32)
Calcium: 9.2 mg/dL (ref 8.4–10.5)
Chloride: 105 meq/L (ref 96–112)
Creatinine, Ser: 1.03 mg/dL (ref 0.40–1.50)
GFR: 95.09 mL/min (ref >60.00)
Glucose, Bld: 94 mg/dL (ref 70–99)
Potassium: 4.1 meq/L (ref 3.5–5.1)
Sodium: 139 meq/L (ref 135–145)
Total Bilirubin: 0.7 mg/dL (ref 0.2–1.2)
Total Protein: 6.9 g/dL (ref 6.0–8.3)

## 2023-12-20 LAB — LIPID PANEL
Cholesterol: 151 mg/dL (ref 0–200)
HDL: 28.2 mg/dL — ABNORMAL LOW (ref >39.00)
LDL Cholesterol: 101 mg/dL — ABNORMAL HIGH (ref 0–99)
NonHDL: 122.64
Total CHOL/HDL Ratio: 5
Triglycerides: 110 mg/dL (ref 0.0–149.0)
VLDL: 22 mg/dL (ref 0.0–40.0)

## 2023-12-20 LAB — CBC
HCT: 41.8 % (ref 39.0–52.0)
Hemoglobin: 13.9 g/dL (ref 13.0–17.0)
MCHC: 33.3 g/dL (ref 30.0–36.0)
MCV: 84.6 fl (ref 78.0–100.0)
Platelets: 290 10*3/uL (ref 150.0–400.0)
RBC: 4.95 Mil/uL (ref 4.22–5.81)
RDW: 13.2 % (ref 11.5–15.5)
WBC: 5.6 10*3/uL (ref 4.0–10.5)

## 2023-12-21 ENCOUNTER — Encounter: Payer: Self-pay | Admitting: Family Medicine

## 2024-02-06 ENCOUNTER — Other Ambulatory Visit: Payer: Self-pay

## 2024-02-06 DIAGNOSIS — Z302 Encounter for sterilization: Secondary | ICD-10-CM

## 2024-02-10 ENCOUNTER — Other Ambulatory Visit

## 2024-02-10 DIAGNOSIS — Z302 Encounter for sterilization: Secondary | ICD-10-CM | POA: Diagnosis not present

## 2024-02-10 DIAGNOSIS — M7651 Patellar tendinitis, right knee: Secondary | ICD-10-CM | POA: Diagnosis not present

## 2024-02-11 ENCOUNTER — Other Ambulatory Visit (HOSPITAL_BASED_OUTPATIENT_CLINIC_OR_DEPARTMENT_OTHER): Payer: Self-pay

## 2024-02-11 LAB — POST-VAS SPERM EVALUATION,QUAL: Volume: 0.8 mL

## 2024-02-11 MED ORDER — MELOXICAM 15 MG PO TABS
15.0000 mg | ORAL_TABLET | Freq: Every day | ORAL | 3 refills | Status: AC | PRN
  Filled 2024-02-11: qty 30, 30d supply, fill #0

## 2024-02-11 MED ORDER — PREDNISONE 10 MG PO TABS
ORAL_TABLET | ORAL | 0 refills | Status: AC
  Filled 2024-02-11: qty 21, 6d supply, fill #0

## 2024-02-11 MED ORDER — CEPHALEXIN 500 MG PO CAPS
500.0000 mg | ORAL_CAPSULE | Freq: Four times a day (QID) | ORAL | 0 refills | Status: AC
  Filled 2024-02-11: qty 40, 10d supply, fill #0

## 2024-02-12 ENCOUNTER — Ambulatory Visit: Payer: Self-pay | Admitting: Urology

## 2024-02-17 ENCOUNTER — Other Ambulatory Visit

## 2024-03-02 DIAGNOSIS — M7651 Patellar tendinitis, right knee: Secondary | ICD-10-CM | POA: Diagnosis not present

## 2024-04-22 ENCOUNTER — Other Ambulatory Visit (HOSPITAL_BASED_OUTPATIENT_CLINIC_OR_DEPARTMENT_OTHER): Payer: Self-pay

## 2024-04-24 ENCOUNTER — Other Ambulatory Visit (HOSPITAL_BASED_OUTPATIENT_CLINIC_OR_DEPARTMENT_OTHER): Payer: Self-pay
